# Patient Record
Sex: Male | Born: 1990 | Race: Black or African American | Hispanic: No | Marital: Single | Smoking: Current every day smoker
Health system: Southern US, Community
[De-identification: ages and names within clinical notes are randomized; demographics above are authoritative.]

---

## 2013-05-28 ENCOUNTER — Emergency Department (HOSPITAL_COMMUNITY)
Admission: EM | Admit: 2013-05-28 | Discharge: 2013-05-28 | Disposition: A | Discharge status: Home / Self Care | Payer: No Typology Code available for payment source | Attending: Emergency Medicine | Admitting: Emergency Medicine

## 2013-05-28 ENCOUNTER — Emergency Department (HOSPITAL_COMMUNITY): Payer: No Typology Code available for payment source

## 2013-05-28 DIAGNOSIS — S90511A Abrasion, right ankle, initial encounter: Secondary | ICD-10-CM

## 2013-05-28 DIAGNOSIS — IMO0002 Reserved for concepts with insufficient information to code with codable children: Secondary | ICD-10-CM | POA: Insufficient documentation

## 2013-05-28 DIAGNOSIS — Y92410 Unspecified street and highway as the place of occurrence of the external cause: Secondary | ICD-10-CM | POA: Insufficient documentation

## 2013-05-28 DIAGNOSIS — Y939 Activity, unspecified: Secondary | ICD-10-CM | POA: Insufficient documentation

## 2013-05-28 MED ORDER — IBUPROFEN 600 MG PO TABS: 600.0000 mg | ORAL_TABLET | Freq: Four times a day (QID) | ORAL | Status: AC | PRN

## 2013-05-28 MED ORDER — HYDROCODONE-ACETAMINOPHEN 5-325 MG PO TABS: 1.0000 | ORAL_TABLET | ORAL | Status: AC | PRN
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
## 2013-05-28 NOTE — ED Provider Notes (Signed)
Medical screening examination/treatment/procedure(s) were performed by non-physician practitioner and as supervising physician I was immediately available for consultation/collaboration.  Jerriah Ines, MD 05/28/13 0605 

## 2013-05-28 NOTE — Progress Notes (Signed)
Orthopedic Tech Progress Note Patient Details:  Anthony Ellis 06/23/1991 [id number]  Ortho Devices Type of Ortho Device: Crutches   [name] 05/28/2013, 3:36 AM

## 2016-08-30 ENCOUNTER — Emergency Department (HOSPITAL_COMMUNITY): Admission: EM | Admit: 2016-08-30 | Discharge: 2016-08-30 | Discharge status: Against Medical Advice | Payer: Self-pay

## 2016-08-30 NOTE — ED Notes (Signed)
Called multiple time and no answer.

## 2016-09-01 ENCOUNTER — Emergency Department (HOSPITAL_COMMUNITY)
Admission: EM | Admit: 2016-09-01 | Discharge: 2016-09-01 | Disposition: A | Discharge status: Home / Self Care | Payer: Self-pay | Attending: Emergency Medicine | Admitting: Emergency Medicine

## 2016-09-01 ENCOUNTER — Emergency Department (HOSPITAL_COMMUNITY): Payer: Self-pay

## 2016-09-01 ENCOUNTER — Encounter (HOSPITAL_COMMUNITY): Payer: Self-pay

## 2016-09-01 DIAGNOSIS — F17200 Nicotine dependence, unspecified, uncomplicated: Secondary | ICD-10-CM | POA: Insufficient documentation

## 2016-09-01 DIAGNOSIS — R258 Other abnormal involuntary movements: Secondary | ICD-10-CM | POA: Insufficient documentation

## 2016-09-01 DIAGNOSIS — G249 Dystonia, unspecified: Secondary | ICD-10-CM

## 2016-09-01 LAB — BASIC METABOLIC PANEL
ANION GAP: 8 (ref 5–15)
BUN: 11 mg/dL (ref 6–20)
CO2: 24 mmol/L (ref 22–32)
Calcium: 9.4 mg/dL (ref 8.9–10.3)
Chloride: 103 mmol/L (ref 101–111)
Creatinine, Ser: 0.96 mg/dL (ref 0.61–1.24)
GFR calc Af Amer: >60 mL/min (ref >60)
Glucose, Bld: 97 mg/dL (ref 65–99)
POTASSIUM: 4 mmol/L (ref 3.5–5.1)
SODIUM: 135 mmol/L (ref 135–145)

## 2016-09-01 LAB — URINALYSIS, ROUTINE W REFLEX MICROSCOPIC
BILIRUBIN URINE: NEGATIVE
Glucose, UA: NEGATIVE mg/dL
Hgb urine dipstick: NEGATIVE
KETONES UR: NEGATIVE mg/dL
LEUKOCYTES UA: NEGATIVE
Nitrite: NEGATIVE
Protein, ur: NEGATIVE mg/dL
Specific Gravity, Urine: 1.005 (ref 1.005–1.030)
pH: 6 (ref 5.0–8.0)

## 2016-09-01 LAB — CBC
HCT: 43.2 % (ref 39.0–52.0)
Hemoglobin: 14.4 g/dL (ref 13.0–17.0)
MCH: 28.4 pg (ref 26.0–34.0)
MCHC: 33.3 g/dL (ref 30.0–36.0)
MCV: 85.2 fL (ref 78.0–100.0)
Platelets: 323 10*3/uL (ref 150–400)
RBC: 5.07 MIL/uL (ref 4.22–5.81)
RDW: 12.7 % (ref 11.5–15.5)
WBC: 11.6 10*3/uL — ABNORMAL HIGH (ref 4.0–10.5)

## 2016-09-01 MED ORDER — DIPHENHYDRAMINE HCL 25 MG PO CAPS
25.0000 mg | ORAL_CAPSULE | Freq: Once | ORAL | Status: AC
  Administered 2016-09-01: 25 mg via ORAL
  Filled 2016-09-01: qty 1

## 2016-09-01 NOTE — ED Triage Notes (Signed)
Pt states that for the past three days his L arm and hand have been "locking up and a tickling behind his L knee"  Denies pain. Pt states that the symptoms go away and come back.

## 2016-09-01 NOTE — ED Notes (Addendum)
Pt states symptoms of left hand "locking up and getting light behind left knee, neck pulls to left side when getting excited over last 3 days.States more than 20 episodes over last 3 days. Pt denies symptoms at present.

## 2016-09-01 NOTE — ED Notes (Signed)
Pt home stable with steady gait after states he understands instructions and will follow up and return if needed.

## 2016-09-01 NOTE — ED Notes (Signed)
Patient transported to CT 

## 2016-09-01 NOTE — ED Provider Notes (Signed)
[facility] Provider Note   CSN: [id number] Arrival date & time: 09/01/16  [id number]     History   Chief Complaint Chief Complaint  Patient presents with  . L sided numbness    HPI Anthony Ellis is a 25 y.o. male.He presents today complaining of 3 days of intermittent left-sided neck hand spasms. These consist of his head turning to the left and contracture of his left hand. These are momentary lasting 10-15 seconds intermittently. They then resolved. He has also noted some discomfort in the back of his left knee at the same time. He has not had similar symptoms in the past. They do not cause him to fall or have inability to walk, uses hand, C, or speak. He has not had similar episodes in the past. He denies starting any new medications or taking any illicit drugs.  HPI  History reviewed. No pertinent past medical history.  There are no active problems to display for this patient.   History reviewed. No pertinent surgical history.     Home Medications    Prior to Admission medications   Medication Sig Start Date End Date Taking? Authorizing Provider  HYDROcodone-acetaminophen (NORCO/VICODIN) 5-325 MG per tablet Take 1 tablet by mouth every 4 (four) hours as needed for pain. 05/28/13   Earley FavorGail Schulz, NP  ibuprofen (ADVIL,MOTRIN) 600 MG tablet Take 1 tablet (600 mg total) by mouth every 6 (six) hours as needed for pain. 05/28/13   Earley FavorGail Schulz, NP    Family History No family history on file.  Social History Social History  Substance Use Topics  . Smoking status: Current Every Day Smoker  . Smokeless tobacco: Never Used  . Alcohol use No     Allergies   Patient has no known allergies.   Review of Systems Review of Systems  All other systems reviewed and are negative.    Physical Exam Updated Vital Signs BP (!) 149/101 (BP Location: Left Arm)   Pulse 98   Temp 98.5 F (36.9 C) (Oral)   Resp 18   Ht 6\' 4"  (1.93 m)   Wt (!) 160.2 kg   SpO2 98%   BMI  43.00 kg/m   Physical Exam  Constitutional: He is oriented to person, place, and time. He appears well-developed and well-nourished.  HENT:  Head: Normocephalic and atraumatic.  Right Ear: Tympanic membrane and external ear normal.  Left Ear: Tympanic membrane and external ear normal.  Nose: Nose normal. Right sinus exhibits no maxillary sinus tenderness and no frontal sinus tenderness. Left sinus exhibits no maxillary sinus tenderness and no frontal sinus tenderness.  Eyes: Conjunctivae and EOM are normal. Pupils are equal, round, and reactive to light. Right eye exhibits no nystagmus. Left eye exhibits no nystagmus.  Neck: Normal range of motion. Neck supple.  Cardiovascular: Normal rate, regular rhythm, normal heart sounds and intact distal pulses.   Pulmonary/Chest: Effort normal and breath sounds normal. No respiratory distress. He exhibits no tenderness.  Abdominal: Soft. Bowel sounds are normal. He exhibits no distension and no mass. There is no tenderness.  Musculoskeletal: Normal range of motion. He exhibits no edema or tenderness.  Neurological: He is alert and oriented to person, place, and time. He has normal strength. He displays normal reflexes. No cranial nerve deficit or sensory deficit. He exhibits normal muscle tone. He displays a negative Romberg sign. Coordination normal. GCS eye subscore is 4. GCS verbal subscore is 5. GCS motor subscore is 6. He displays no Babinski's sign on the right  side. He displays no Babinski's sign on the left side.  Reflex Scores:      Bicep reflexes are 1+ on the right side and 1+ on the left side.      Patellar reflexes are 2+ on the right side and 2+ on the left side. Patient with normal gait without ataxia, shuffling, spasm, or antalgia. Speech is normal without dysarthria, dysphasia, or aphasia. Muscle strength is 5/5 in bilateral shoulders, elbow flexor and extensors, wrist flexor and extensors, and intrinsic hand muscles. 5/5 bilateral  lower extremity hip flexors, extensors, knee flexors and extensors, and ankle dorsi and plantar flexors.    Skin: Skin is warm and dry. No rash noted.  Psychiatric: He has a normal mood and affect. His behavior is normal. Judgment and thought content normal.  Nursing note and vitals reviewed.    ED Treatments / Results  Labs (all labs ordered are listed, but only abnormal results are displayed) Labs Reviewed  CBC - Abnormal; Notable for the following:       Result Value   WBC 11.6 (*)    All other components within normal limits  URINALYSIS, ROUTINE W REFLEX MICROSCOPIC - Abnormal; Notable for the following:    Color, Urine STRAW (*)    All other components within normal limits  BASIC METABOLIC PANEL  CBG MONITORING, ED    EKG  EKG Interpretation None       Radiology No results found.  Procedures Procedures (including critical care time)  Medications Ordered in ED Medications  diphenhydrAMINE (BENADRYL) capsule 25 mg (not administered)     Initial Impression / Assessment and Plan / ED Course  I have reviewed the triage vital signs and the nursing notes.  Pertinent labs & imaging results that were available during my care of the patient were reviewed by me and considered in my medical decision making (see chart for details).  Clinical Course   This is a [age] rolled male who has been having intermittent spasming of his neck and hand muscles on the left. Neurological exam is normal. Historically sounds like a dystonic reaction, but he has no instigating factors including medications. He denies he has taken any illicit drugs. Given Benadryl and had no symptoms here. He is advised regarding need for follow-up, return precautions and voices understanding.  Final Clinical Impressions(s) / ED Diagnoses   Final diagnoses:  None    New Prescriptions New Prescriptions   No medications on file     Margarita Grizzleanielle Jaxon Flatt, MD 09/01/16 [phone]

## 2017-08-21 IMAGING — CT CT HEAD W/O CM
3 of 4 series · 13 of 47 positions shown, 15 images · non-contrast
Comparison: None.

CLINICAL DATA: intermittent episodes of LEFT arm/leg spasms; pt has
tingling behind his LEFT knee, followed by total stiffness of LEFT
arm/hand. This lasts approximately 20 seconds, pt is fully aware the
entire time. Pt states that he's had these symptoms x 3 days,
increasing in frequency. Pt denies having any pain, denies injury

EXAM:
CT HEAD WITHOUT CONTRAST
TECHNIQUE: Contiguous axial images were obtained from the base of the skull
through the vertex without intravenous contrast.

[Series 2: head without · axial · non-contrast · 0.47mm/px · z∈[-118,+7]mm · 7 of 35 slices shown, 9 images]
[im 5/35  brain]
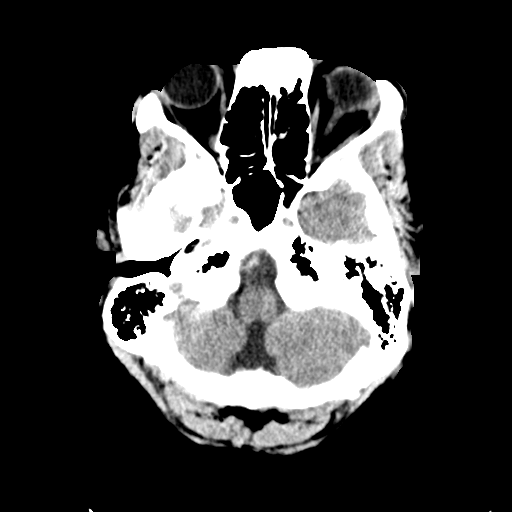
[im 5/35  bone]
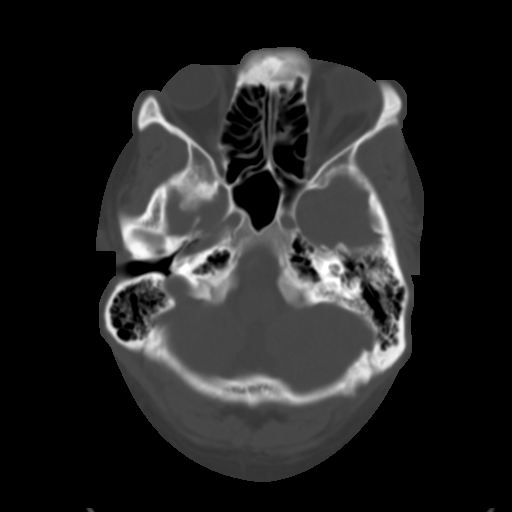
[im 9/35  brain]
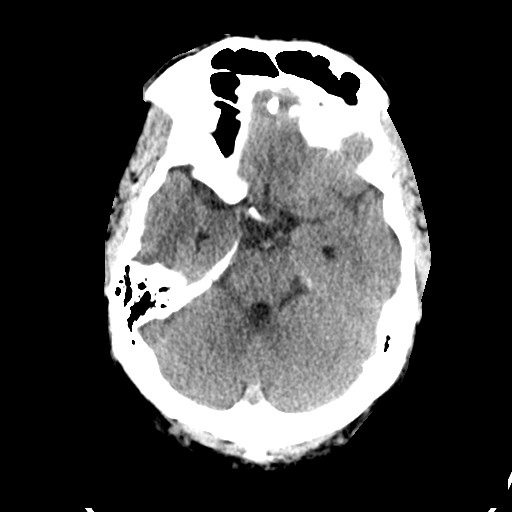
[im 13/35  brain]
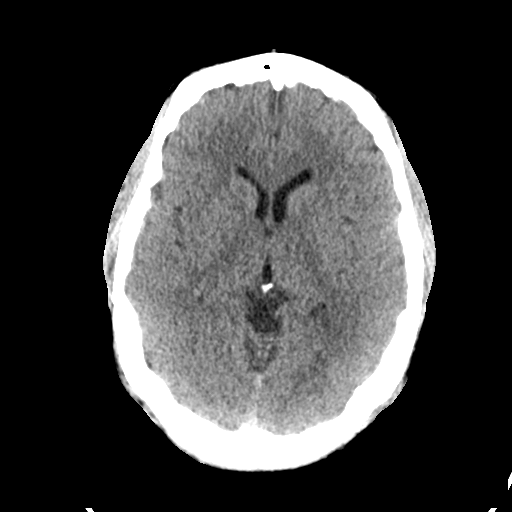
[im 18/35  brain]
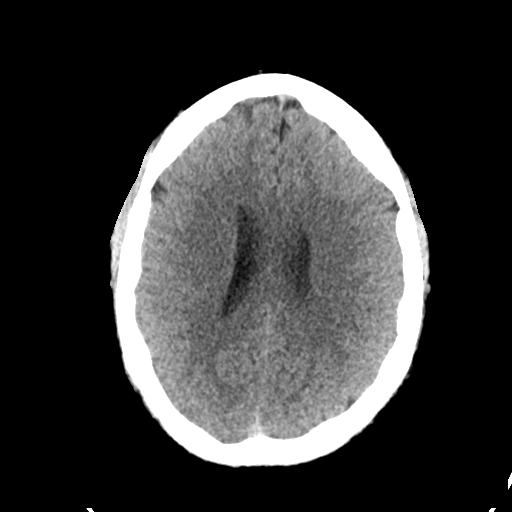
[im 22/35  brain]
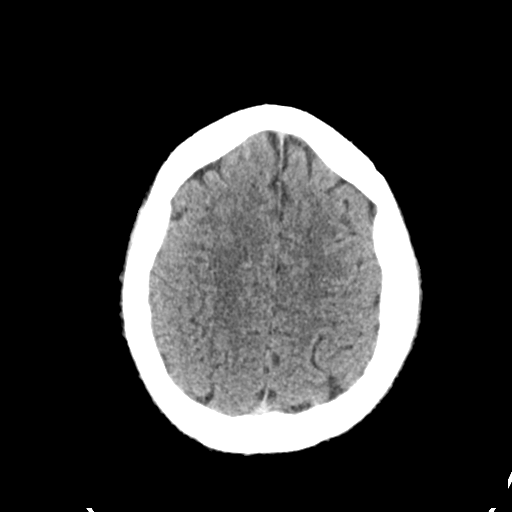
[im 22/35  bone]
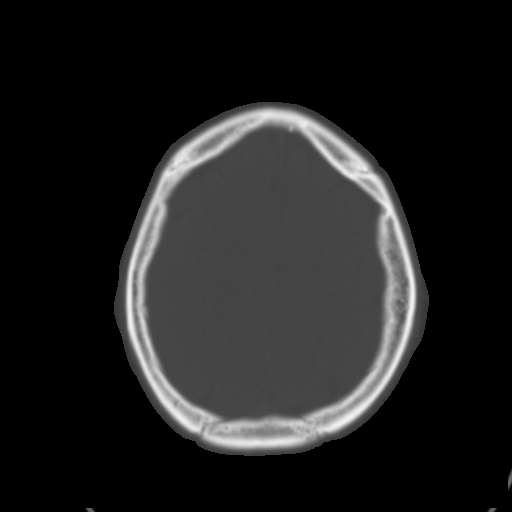
[im 26/35  brain]
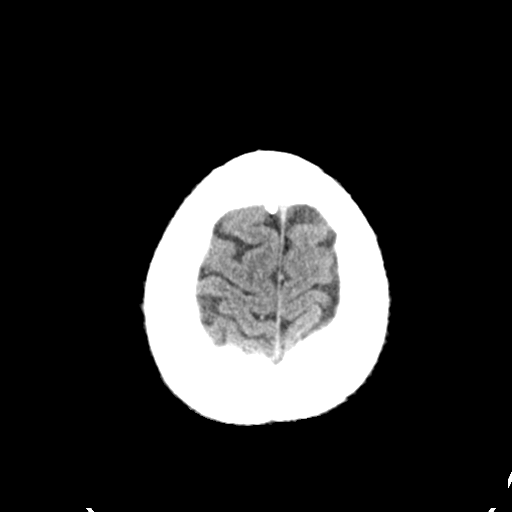
[im 30/35  brain]
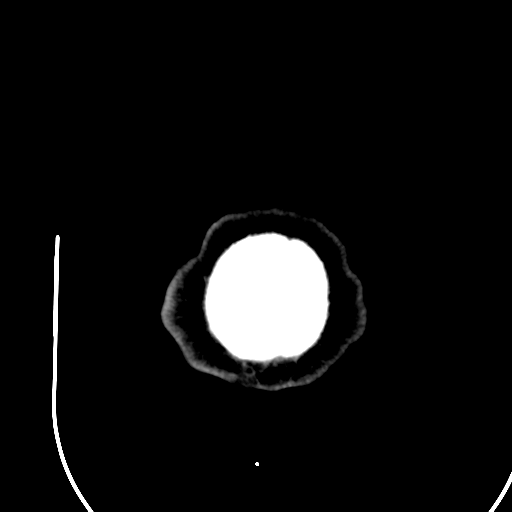

[Series 4: head without cor · coronal · non-contrast · 0.33mm/px · 3 of 71 slices shown]
[im 24/71  brain]
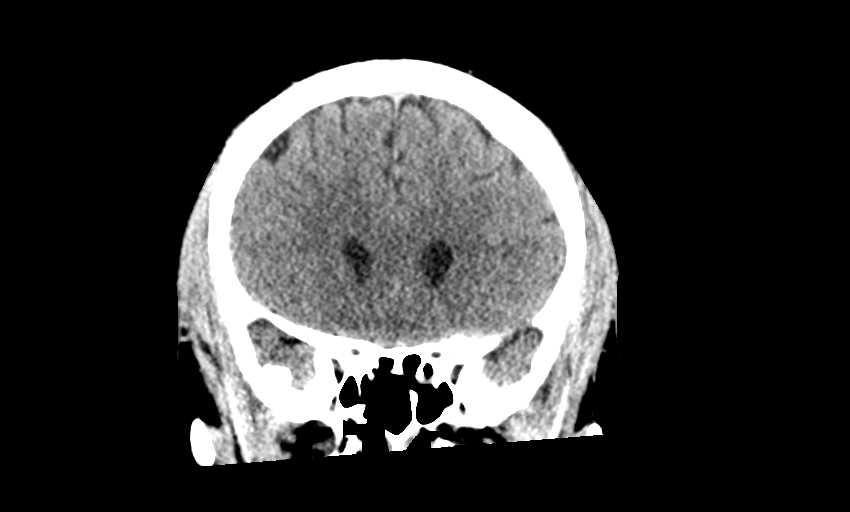
[im 32/71  brain]
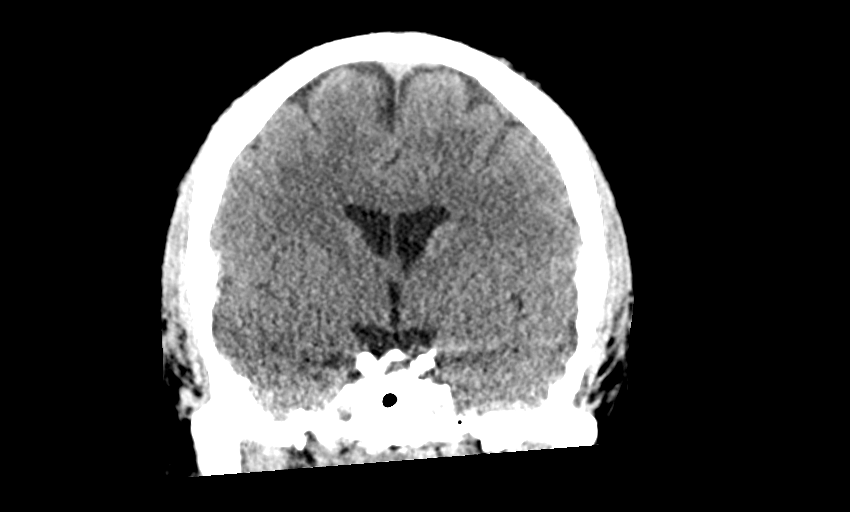
[im 39/71  brain]
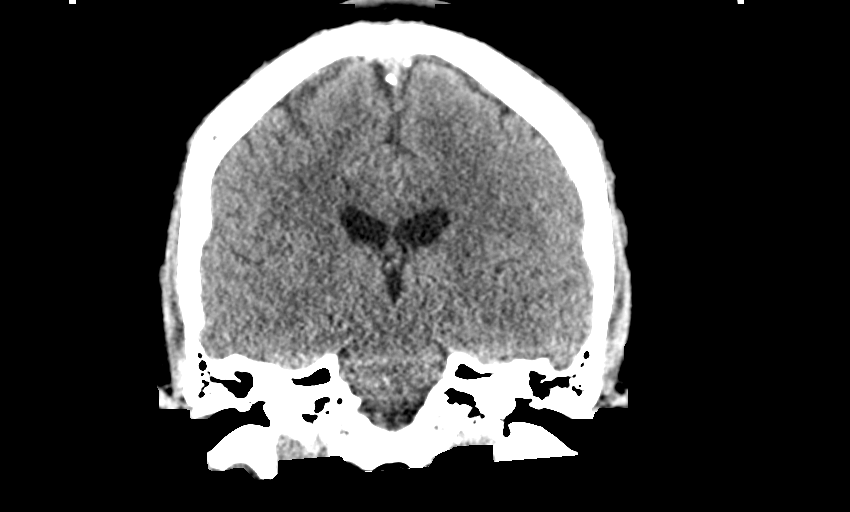

[Series 5: head without sag · sagittal · non-contrast · 0.33mm/px · 3 of 67 slices shown]
[im 23/67  brain]
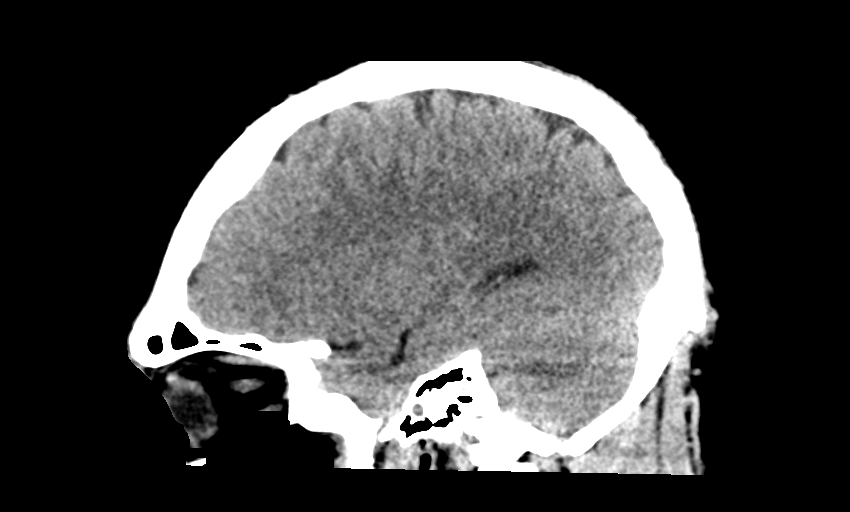
[im 34/67  brain]
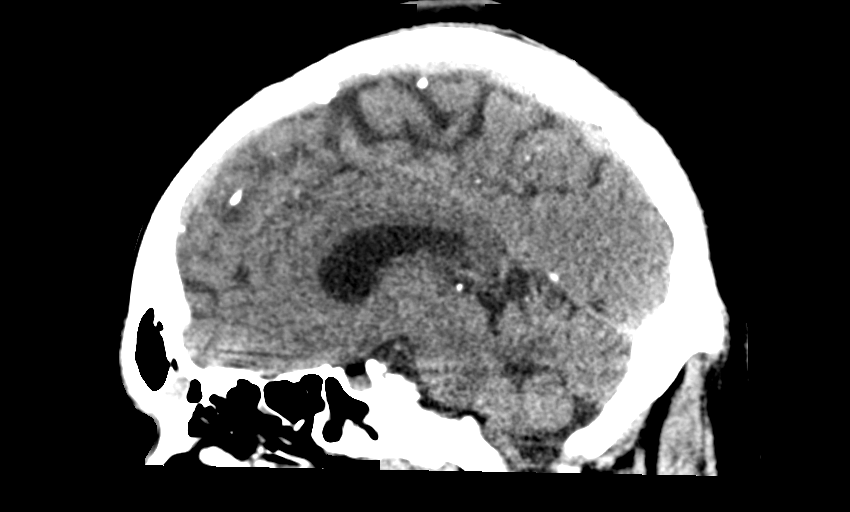
[im 45/67  brain]
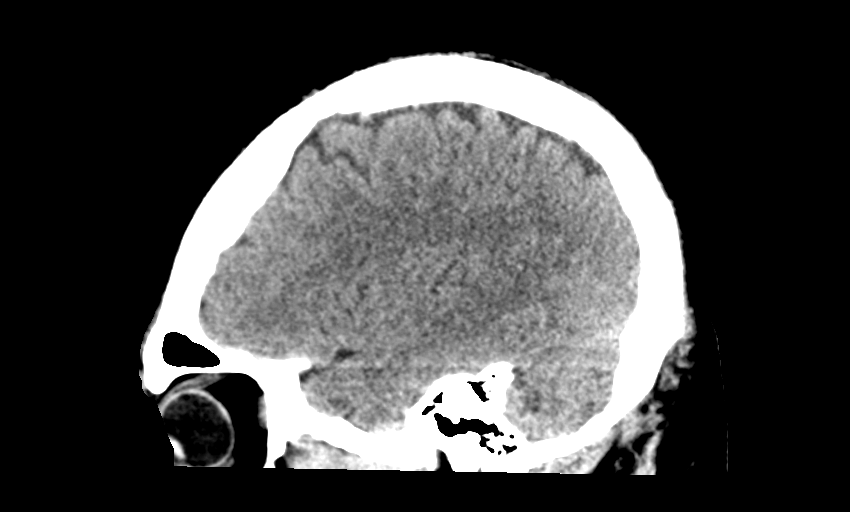

[13 of 47 positions shown; findings below may reference images not displayed]

FINDINGS: Brain: Mild atrophy. No evidence of acute infarction, hemorrhage,
hydrocephalus, extra-axial collection or mass lesion/mass effect.

Vascular: No hyperdense vessel or unexpected calcification.

Skull: Normal. Negative for fracture or focal lesion.

Sinuses/Orbits: No acute finding.

Other: None.
IMPRESSION: 1. Negative for bleed or other acute intracranial process.

## 2020-04-14 ENCOUNTER — Emergency Department (HOSPITAL_COMMUNITY): Admission: EM | Admit: 2020-04-14 | Discharge: 2020-04-15 | Discharge status: Against Medical Advice | Payer: Self-pay

## 2020-04-15 ENCOUNTER — Encounter (HOSPITAL_COMMUNITY): Payer: Self-pay | Admitting: Emergency Medicine

## 2020-04-15 ENCOUNTER — Ambulatory Visit (HOSPITAL_COMMUNITY)
Admission: EM | Admit: 2020-04-15 | Discharge: 2020-04-15 | Disposition: A | Discharge status: Home / Self Care | Payer: Self-pay | Attending: Physician Assistant | Admitting: Physician Assistant

## 2020-04-15 ENCOUNTER — Other Ambulatory Visit: Payer: Self-pay

## 2020-04-15 DIAGNOSIS — H539 Unspecified visual disturbance: Secondary | ICD-10-CM

## 2020-04-15 LAB — CBG MONITORING, ED: Glucose-Capillary: 87 mg/dL (ref 70–99)
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
## 2020-04-15 NOTE — Discharge Instructions (Addendum)
Do not eat or drink anything after midnight tonight.
